# Patient Record
Sex: Male | Born: 1945 | Race: White | Hispanic: No | Marital: Single | Smoking: Never smoker
Health system: Southern US, Community
[De-identification: ages and names within clinical notes are randomized; demographics above are authoritative.]

## PROBLEM LIST (undated history)

## (undated) DIAGNOSIS — E119 Type 2 diabetes mellitus without complications: Secondary | ICD-10-CM

---

## 1997-11-07 ENCOUNTER — Ambulatory Visit (HOSPITAL_COMMUNITY): Admission: RE | Admit: 1997-11-07 | Discharge: 1997-11-07 | Payer: Self-pay | Admitting: Family Medicine

## 2003-05-29 ENCOUNTER — Emergency Department (HOSPITAL_COMMUNITY): Admission: EM | Admit: 2003-05-29 | Discharge: 2003-05-29 | Payer: Self-pay | Admitting: Emergency Medicine

## 2007-06-12 ENCOUNTER — Ambulatory Visit: Payer: Self-pay | Admitting: Pulmonary Disease

## 2007-06-12 ENCOUNTER — Ambulatory Visit: Payer: Self-pay | Admitting: Infectious Diseases

## 2007-06-12 ENCOUNTER — Inpatient Hospital Stay (HOSPITAL_COMMUNITY): Admission: EM | Admit: 2007-06-12 | Discharge: 2007-06-15 | Payer: Self-pay | Admitting: Emergency Medicine

## 2007-06-14 ENCOUNTER — Encounter: Payer: Self-pay | Admitting: Pulmonary Disease

## 2007-07-14 ENCOUNTER — Ambulatory Visit: Payer: Self-pay | Admitting: Pulmonary Disease

## 2007-07-14 DIAGNOSIS — F17200 Nicotine dependence, unspecified, uncomplicated: Secondary | ICD-10-CM

## 2007-07-14 DIAGNOSIS — Z9981 Dependence on supplemental oxygen: Secondary | ICD-10-CM

## 2007-07-14 DIAGNOSIS — G4730 Sleep apnea, unspecified: Secondary | ICD-10-CM | POA: Insufficient documentation

## 2007-07-14 DIAGNOSIS — J449 Chronic obstructive pulmonary disease, unspecified: Secondary | ICD-10-CM

## 2007-07-14 DIAGNOSIS — J4489 Other specified chronic obstructive pulmonary disease: Secondary | ICD-10-CM | POA: Insufficient documentation

## 2007-07-18 DIAGNOSIS — I10 Essential (primary) hypertension: Secondary | ICD-10-CM | POA: Insufficient documentation

## 2007-07-18 DIAGNOSIS — E785 Hyperlipidemia, unspecified: Secondary | ICD-10-CM

## 2007-07-25 ENCOUNTER — Encounter: Payer: Self-pay | Admitting: Pulmonary Disease

## 2009-05-21 IMAGING — CT CT ANGIO CHEST
4 of 5 series · 19 of 30 positions shown · IV contrast (100ml omni 350)
Comparison: Chest x-ray dated 06/12/07.

CLINICAL DATA: COPD exacerbation.  Rule out pulmonary embolus.
 CT ANGIOGRAPHY OF CHEST:
TECHNIQUE: Multidetector CT imaging of the chest was performed during bolus injection of intravenous contrast.  Multiplanar CT angiographic image reconstructions were generated to evaluate the vascular anatomy.
 Contrast:  100 cc Omnipaque 350

[Series 2: pe · axial · 0.86mm/px · z∈[-306,-89]mm · 8 of 233 slices shown]
[im 30/233  lung]
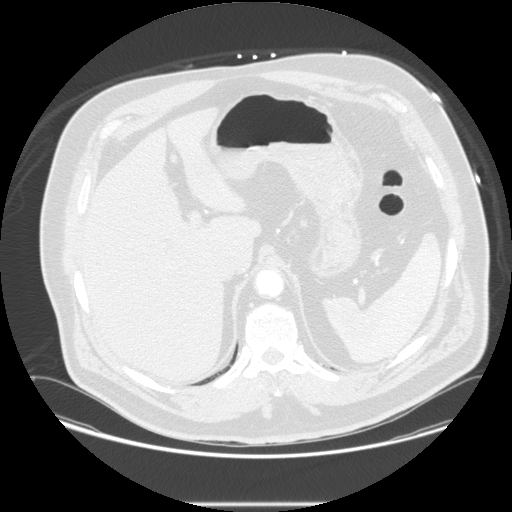
[im 59/233  mediastinal]
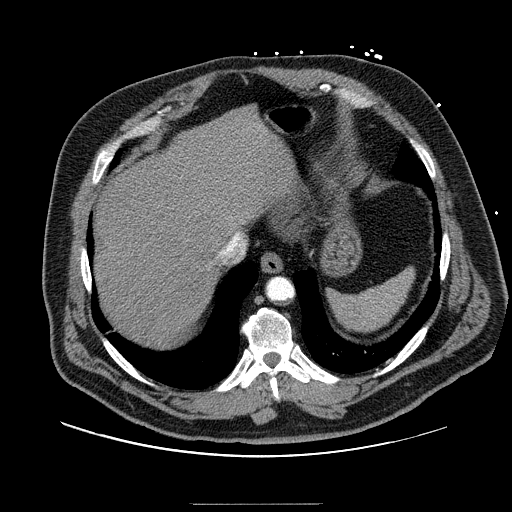
[im 88/233  lung]
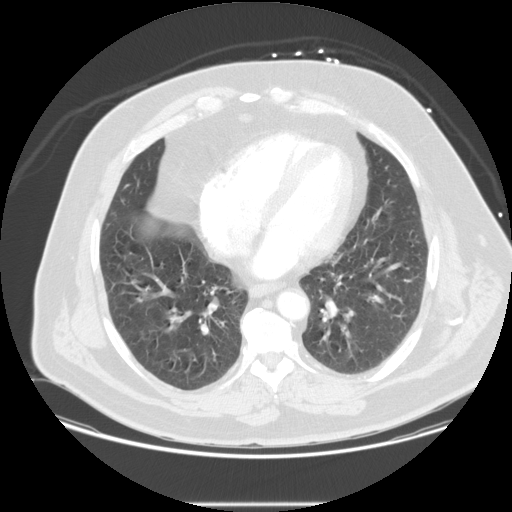
[im 117/233  mediastinal]
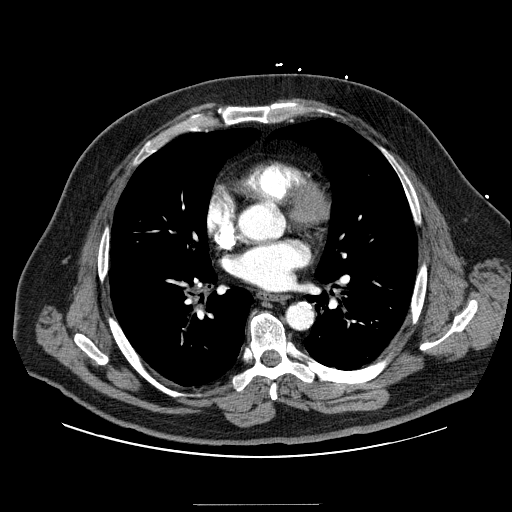
[im 140/233  lung]
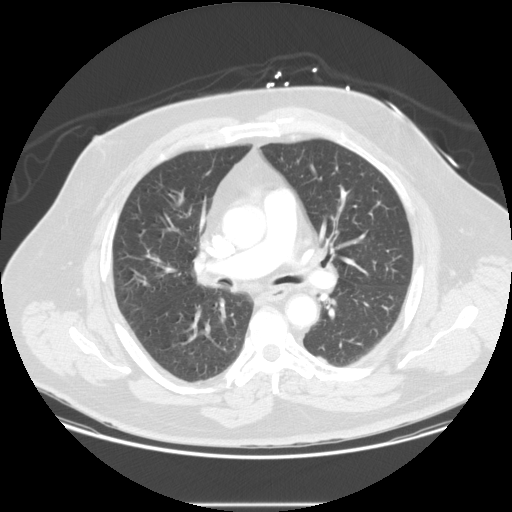
[im 146/233  mediastinal]
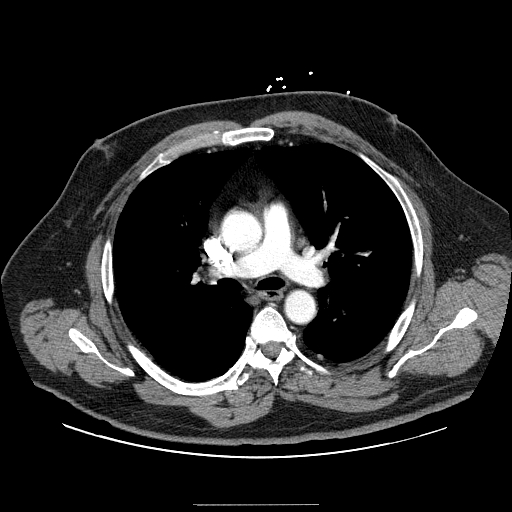
[im 175/233  lung]
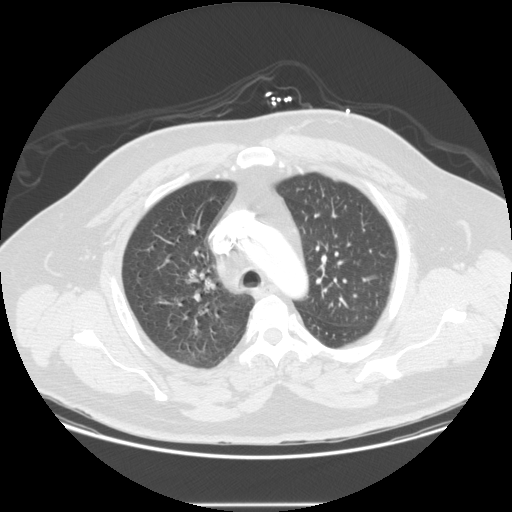
[im 204/233  mediastinal]
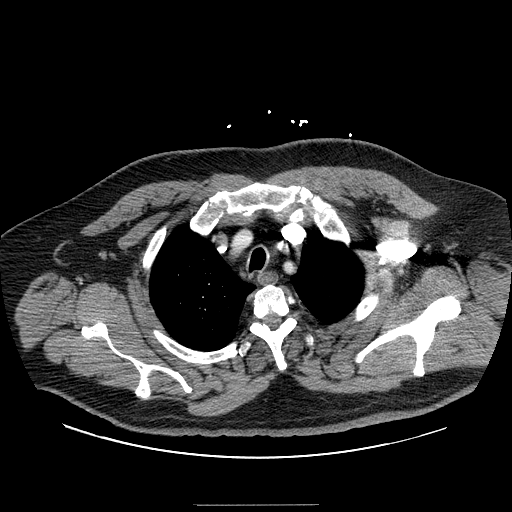

[Series 3: recon 2: pe · axial · 0.86mm/px · z∈[-270,-125]mm · 4 of 117 slices shown]
[im 30/117  lung]
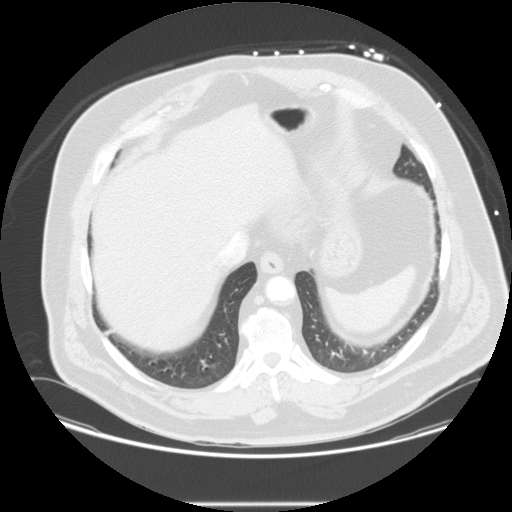
[im 59/117  lung]
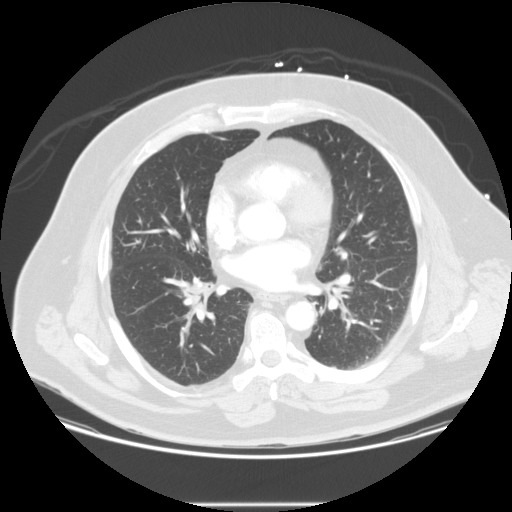
[im 71/117  lung]
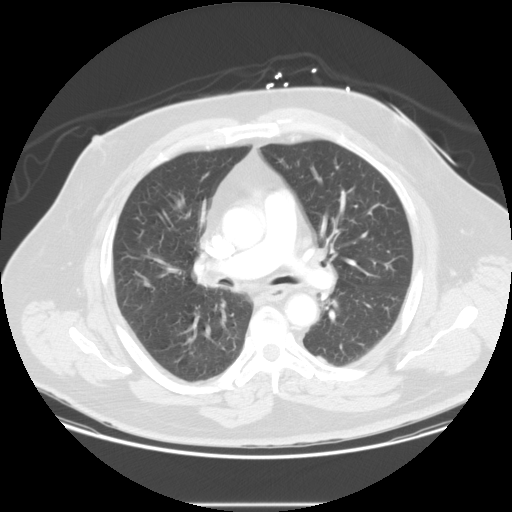
[im 88/117  lung]
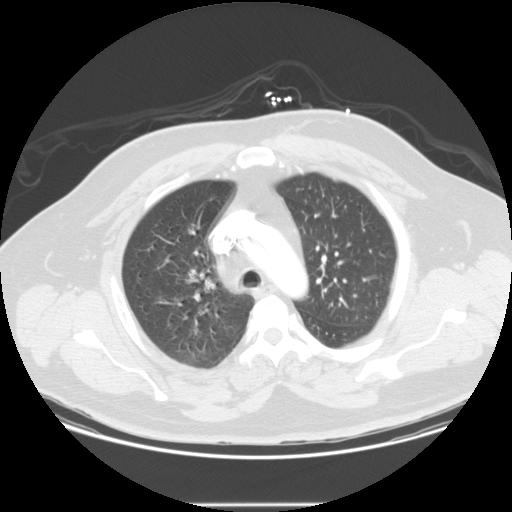

[Series 201: reformatted · sagittal · 0.86mm/px · 5 of 174 slices shown (1 of 2)]
[im 29/174  lung]
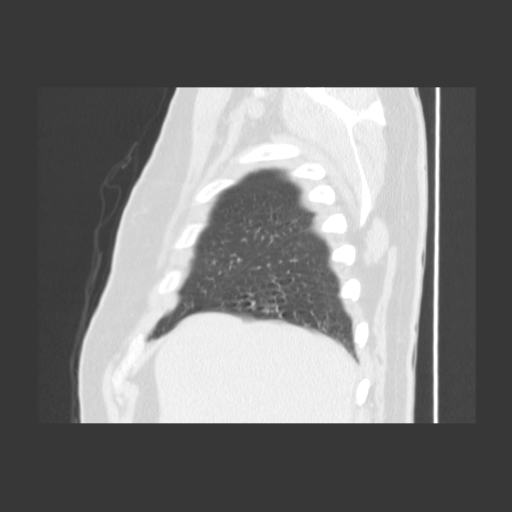
[im 58/174  lung]
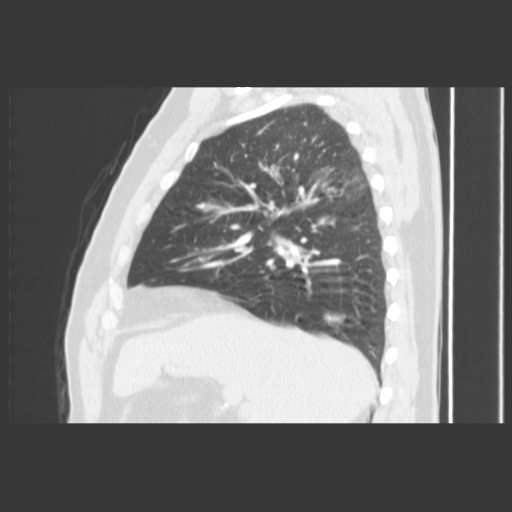
[im 87/174  lung]
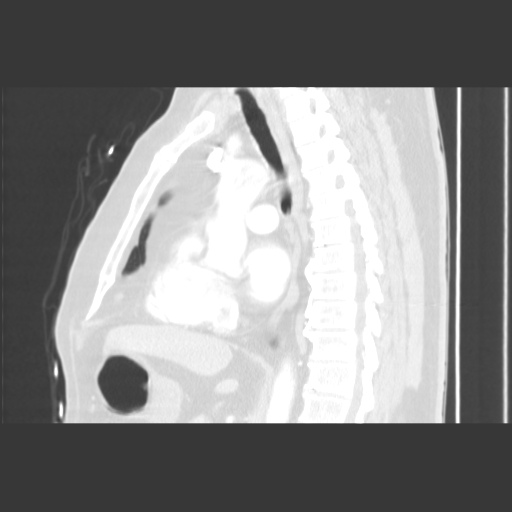
[im 116/174  lung]
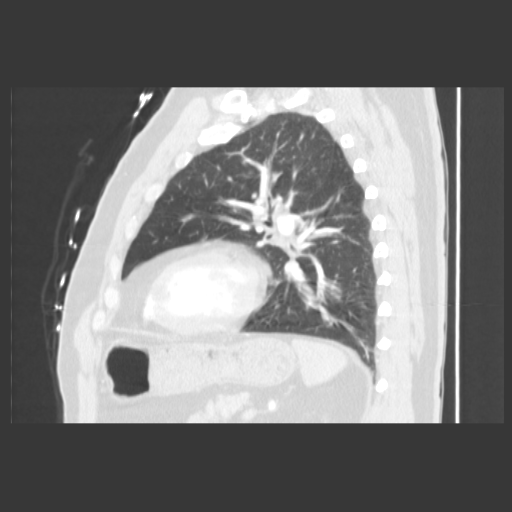
[im 145/174  lung]
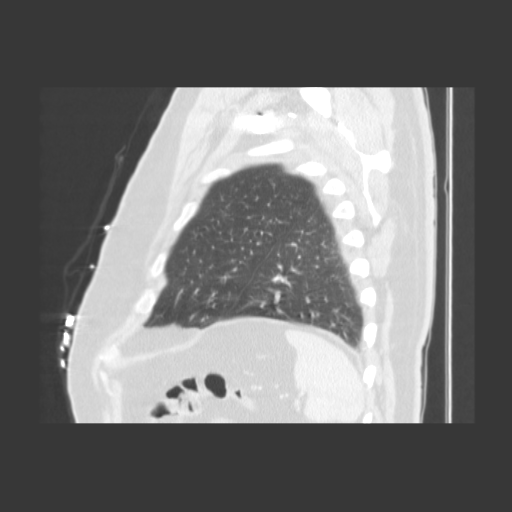

[Series 202: reformatted · coronal · 0.86mm/px · 2 of 142 slices shown (2 of 2)]
[im 29/142  lung]
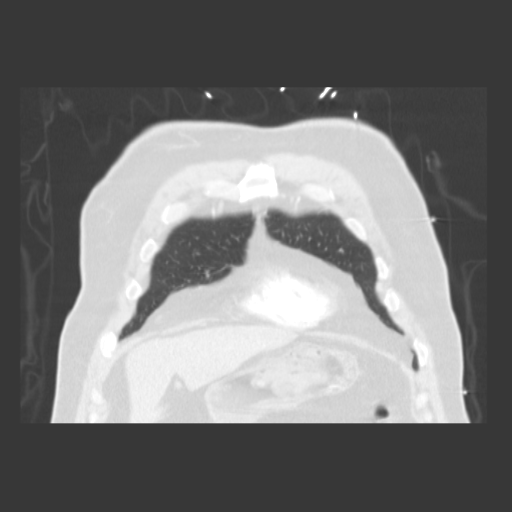
[im 57/142  lung]
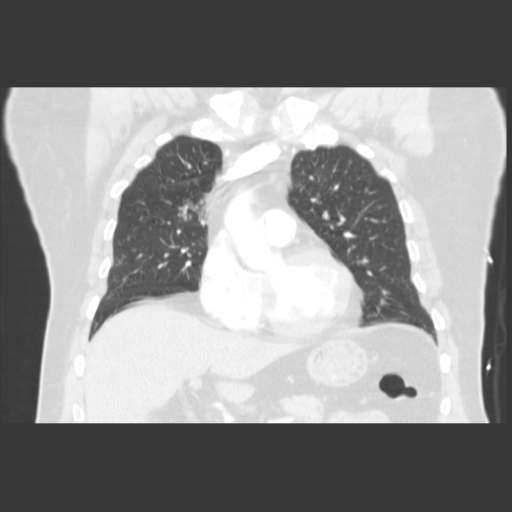

[19 of 30 positions shown; findings below may reference images not displayed]

FINDINGS: Images of the thoracic inlet are unremarkable.  Heart size within normal limits.  Mild degenerative changes of thoracic spine are seen.  Mild atherosclerotic calcification of aortic knob are seen.  There is no aortic aneurysm.  There is no hilar or mediastinal adenopathy.  No pulmonary embolus is noted.  Bilateral mild central peribronchial thickening noted.  Peribronchial inflammation is suspected.  
 Images of the lung parenchyma shows no acute infiltrate or pleural effusion.  No focal consolidation is noted.  
 There is a nonspecific anterior pericardiac mediastinal lymph node which measures 1 x .8 cm.  
 Images of the upper abdomen shows mild hepatic fatty infiltration.  No adrenal gland mass is noted.  
 Computer reconstructed images of thoracic aorta shows again no evidence of pulmonary embolus.
IMPRESSION: 1.  Mild bilateral perihilar/peribronchial thickening.  Mild peribronchial inflammation is suspected.  
 2.  No pulmonary embolus. 
 3.  No acute infiltrate or pleural effusion.  
 4.  Nonspecific anterior mediastinal pericardiac lymph node measures 1 x .8 cm.

## 2010-08-12 NOTE — H&P (Signed)
Micheal Carlson, Micheal Carlson               ACCOUNT NO.:  [id number]   MEDICAL RECORD NO.:  [id number]          PATIENT TYPE:  EMS   LOCATION:  MAJO                         FACILITY:  [facility]   PHYSICIAN:  Mick Sell, MD DATE OF BIRTH:  March 15, 1946   DATE OF ADMISSION:  06/12/2007  DATE OF DISCHARGE:                              HISTORY & PHYSICAL   ADMITTING TO:  Dr.  Adela Glimpse.   PRIMARY DOCTOR:  Dr. Marinda Elk.   CHIEF COMPLAINT:  Shortness of breath and cough.   HISTORY OF PRESENT ILLNESS:  This is a pleasant 65 year old white male  with a history of longstanding tobacco abuse, hypertension,  hyperlipidemia, who complains of one week of increasing cough productive  of a yellow-green sputum, as well as subjective fevers, shortness of  breath, and chills.  He reports mild night sweats.  He says he was seen  by Dr. Foy Guadalajara on Tuesday, 4 days ago, and was given samples it sounds  like to levofloxacin.  He was also given a prescription for levofloxacin  but never had this filled.  He says he did not have much improvement  with the levofloxacin, however he does report that his sputum has  changed from yellow-green to clear.  He has not actually taken his  temperature so he does not know if he has had actual fevers, but he does  report subjective fevers.  He does continue to smoke, about a pack and a  half per day, but he does not drink any alcohol.  When he was seen in  the emergency room initially, he was afebrile but hypertensive and with  an O2 saturation of 86% on room air, up to 95% on several liters.  He  was given Solu-Medrol as well as albuterol and Atrovent nebs.  Chest x-  ray showed possible viral pneumonia, but no focal consolidation.   PAST MEDICAL HISTORY:  1. Hypertension.  2. Hyperlipidemia.  The patient denies a history of being told he has COPD or emphysema.  He  denies recent admission or prior admissions for pneumonia or asthma.   PAST SURGICAL HISTORY:  The  patient denies any surgeries, although it is  noted he had a repair of a finger in 2005.   SOCIAL HISTORY:  The patient is a retired Midwife.  He currently smokes  a pack and a half per day.  He says he used to drink heavily but quit 10  years ago.  Denies any other drugs.  He lives alone but does have his  brother in the neighborhood.   FAMILY HISTORY:  Noncontributory.   MEDICATIONS:  1. Simvastatin 80 mg once a day.  2. Hydrochlorothiazide 25 mg once a day.  3. Lisinopril 20 mg once a day.  4. Metoprolol 50 mg b.i.d.  5. Aspirin 1 tablet once a day, 325 mg.   ALLERGIES:  NO KNOWN DRUG ALLERGIES.   PHYSICAL EXAMINATION:  The patient is a heavy-set disheveled white male  in moderate respiratory distress.  His temperature was 97.2, pulse of  94, blood pressure was elevated to 187/100 to 197/87.  Respirations  were  20-28 times per minute.  Saturation was 86% initially on room air.  HEENT:  His conjunctiva are injected but anicteric, his pupils are  equal, round and reactive to light and accommodation.  Extraocular  movements are intact.  His oropharynx is somewhat dry.  NECK:  Supple, no anterior cervical or posterior cervical or  supraclavicular lymphadenopathy.  HEART:  Regular with somewhat distant heart sounds.  LUNGS:  Diffuse crackles and prolonged expiratory phase.  ABDOMEN:  Soft, nontender, nondistended.  No hepatosplenomegaly.  EXTREMITIES:  No cyanosis or clubbing but there is 1+ bilateral pedal  edema.  NEURO:  He is alert and oriented x3 with a gross and nonfocal neuro  exam, although he is somewhat jittery and anxious appearing.   DATA:  The patient had blood work done and it revealed white blood cell  count of 8.6, hemoglobin elevated at 18.5, platelets of 199, 53%  neutrophils, 34% lymphocytes.  His INR is 0.8.  Sodium 134, potassium  4.4, chloride 96, CO2 27, BUN 12, creatinine 0.8, glucose 118.  LFTs are  within normal limits, except mildly elevated ALT of 55.   Albumin is 3.4.  The patient also had a chest x-ray done which showed some peribronchial  cuffing, possibly suggestive of a viral process.  EKG showed normal sinus rhythm with some left axis deviation but no  acute ST or T-wave changes.  There is no prior comparison.   ASSESSMENT:  This is a pleasant 65 year old male, hypertension,  hyperlipidemia, and prolonged heavy smoker without a prior diagnosis of  COPD or emphysema who presents with cough, progressive shortness of  breath, and wheezing and subjective fevers for the last week.  He has  received some levofloxacin without impressive response.  He was hypoxic  when he initially presented, but his chest x-ray is relatively clear.  He does have diffuse expiratory wheezes and prolonged expiratory phase  on his lung exam but his white count is within normal limits.  Differential would include COPD exacerbation, viral pneumonia, CHF  exacerbation.  I think he most likely has COPD with acute exacerbation.   PLAN:  1. We will admit to step-down unit for close monitoring.  We will put      him on oxygen and follow is O2 sats.  If we need to we can get an      ABG.  2. We will give him albuterol, Atrovent nebs as well as prednisone      taper.  He already received Solu-Medrol 120 and we will put him on      60 mg b.i.d. of prednisone.  3. It does not appear he has pneumonia.  He has a normal white count,      but he does report productive sputum and we will start him on      doxycycline for bronchitis, 100 mg p.o. b.i.d.  4. Hypertension.  We have placed him back on his hydrochlorothiazide,      lisinopril and metoprolol.  He is markedly hypertensive, although I      am not sure if his compliance as most of his pill bottles were full      that he had with him and they were filled in December of 2008.  I      will also place him on an aspirin.  5. I will check a BNP just to evaluate for possible CHF, although his      chest x-ray does not  suggest pulmonary  edema.  6. Prophylaxis:  I will place the patient on DVT prophylaxis with      subcutaneous Lovenox.  I will not place him on a proton pump      inhibitor at this point, but that can be reevaluated in the future.      Mick Sell, MD  Electronically Signed     DPF/MEDQ  D:  06/12/2007  T:  06/12/2007  Job:  [id number]

## 2010-08-12 NOTE — Discharge Summary (Signed)
Micheal Carlson, Micheal Carlson               ACCOUNT NO.:  [id number]   MEDICAL RECORD NO.:  [id number]          PATIENT TYPE:  INP   LOCATION:  2626                         FACILITY:  [facility]   PHYSICIAN:  Michiel Cowboy, MDDATE OF BIRTH:  08-Jun-1945   DATE OF ADMISSION:  06/12/2007  DATE OF DISCHARGE:  06/15/2007                               DISCHARGE SUMMARY   DISCHARGE DIAGNOSES:  1. Chronic obstructive pulmonary disease exacerbation.  2. Hyperlipidemia.  3. Hypertension.  4. Tobacco abuse.   STUDIES:  CT of the chest done on June 14, 2007, negative for PE.  Chest x-ray done on June 12, 2007, showing coarse bronchovascular  markings, bronchiolectasis suggests viral process.  No pneumonia noted.   HOSPITAL COURSE:  The patient is a 65 year old gentleman with past  medical history significant for extensive tobacco abuse, hypertension,  and hyperlipidemia presented with severe shortness of breath, was noted  be severely hypoxic requiring at least 4 L of oxygen.  The patient was  admitted for possible COPD exacerbation and was started on prednisone  given the wheezing as well as antibiotics, Avelox, Advair, and Atrovent.  The patient continued to be requiring 4 L of oxygen, but insisted on  going home stating that his grandchild needs to be taken care of.  Home  oxygen was arranged for the patient given severe shortness of breath and  severe hypoxia.  CT scan of the chest was obtained to rule out PE, which  was negative.  Also, Pulmonology was called to see if they have any  final suggestions in improving his hypoxia.  We continued current  management with Advair, Spiriva, prednisone, and antibiotics including  doxycycline 100 mg one tablet twice a day for 4 days for a total of 7  days.   Hypertension.  Continue his home medications.  We will discharge him on  this.   COPD.  The patient to follow up with Dr. Vassie Loll, also to stop smoking.  The patient is to be discharged home on 4 L  of oxygen.   DISCHARGE MEDICATIONS:  1. Advair 250/50 inhale 2 times a day.  2. Spiriva 18 mcg inhale daily.  3. Doxycycline 100 mg p.o. 2 times a day for 4 days.  4. Prednisone taper slowly over 4 weeks.  5. Nicotine patch daily.  6. Albuterol 2 puffs every 4-6 hours as needed for wheezing.  7. Simvastatin 80 mg daily.  8. Hydrochlorothiazide 25 mg p.o. daily.  9. Lisinopril 20 mg p.o. daily.  10.Metoprolol 50 mg p.o. twice a day.  11.Aspirin 325 mg p.o. daily.  12.Home oxygen 4 L.   FOLLOWUP:  The patient to follow up with Dr. Foy Guadalajara in 1 week and follow  up with Dr. Vassie Loll scheduled on the July 14, 2007, at 1:30 p.m.      Michiel Cowboy, MD  Electronically Signed     AVD/MEDQ  D:  07/12/2007  T:  07/12/2007  Job:  [id number]   cc:   Oretha Milch, MD  Doris Cheadle Foy Guadalajara, M.D.

## 2010-08-12 NOTE — Consult Note (Signed)
NAMEFRIEDRICH, Micheal Carlson               ACCOUNT NO.:  [id number]   MEDICAL RECORD NO.:  [id number]          PATIENT TYPE:  INP   LOCATION:  2626                         FACILITY:  [facility]   PHYSICIAN:  Oretha Milch, MD      DATE OF BIRTH:  06-Jan-1946   DATE OF CONSULTATION:  06/14/2007  DATE OF DISCHARGE:                                 CONSULTATION   PRIMARY CARE PHYSICIAN:  Robert L. Foy Guadalajara, M.D.   REASON FOR CONSULTATION:  Persistent hypoxia.   HISTORY OF PRESENT ILLNESS:  Mr. 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  He smokes about 1-1/2 to 2 packs per day for the last  40 years.  He denies chronic dyspnea or sputum production.  About 4 days  prior to admission, he developed green phlegm with subjective fevers,  dyspnea and wheezing.  He was seen by Dr. Foy Guadalajara and given samples of  Levaquin.  He was given a prescription which he did not fill.  His  breathing worsened over the next 2 days.  He sought emergency room  attention and was admitted to the hospital.  Over the past few days, he  was treated with Solu-Medrol and antibiotics and he states that he is  more than 50% better.  However, he remains on 4 liters of oxygen, hence  we are consulted.   The sputum has changed from yellow to clear now.  He is able to ambulate  in the room.  He is insisting that he wants to go home in 24 hours  because there is no one to take care of his grandchild.  There is no  history of chest pain, palpitations or pedal edema.   PAST MEDICAL HISTORY:  Includes hypertension and hyperlipidemia.   PAST SURGICAL HISTORY:  Trauma to finger and the right hand with a car  door.   SOCIAL HISTORY:  Smokes about 1-1/2 to 2 packs per day.  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  Lives with a lady  friend and a 64 year old  grandchild of whom he has custody.   FAMILY HISTORY:  Noncontributory.   REVIEW OF SYSTEMS:  Denies recurrent colds or pneumonias and prefers  cold weather having worked under cold conditions as a Midwife.   ALLERGIES:  NO KNOWN DRUG ALLERGIES.   CURRENT MEDICATIONS:  Medications prior to admission include:  1. HCTZ 25 mg daily.  2. Simvastatin 80 mg daily.  3. Lisinopril 20 mg daily.  4. Metoprolol 50 mg daily.  5. Mucinex DM.  6. Aspirin 325 mg daily.  7. ProAir MDI p.r.n.   PHYSICAL EXAMINATION:  GENERAL:  Adult gentleman sitting up in bed in  mild respiratory distress.  VITAL SIGNS:  Heart rate 96 per minute, respirations 24 per minute,  oxygen saturation 93% on 5 liters nasal cannula, 92% on 4 liters nasal  cannula, blood pressure 142/68.  HEENT:  Narrow pharyngeal space, class III airway.  NECK:  Supple.  No JVD.  Fullness in the supraclavicular region.  ABDOMEN:  Soft, nontender.  CVS:  S1 and S2 distant.  CHEST:  Decreased breath sounds in both bases.  Faint rhonchi.  Prolonged expiration.  NEUROLOGIC:  Nonfocal.  No asterixis.   LABORATORY DATA:  Arterial blood gas was 7.44, 44, 59, 91% on 4 liters  nasal cannula.  WBC count on admission was 8.6, hemoglobin 18.5,  platelets 199.  BNP level was less than 30, BUN and creatinine on  admission was 12 and 0.8, potassium 4.4, sodium 134, bicarbonate 27,  SGPT was 3455, total bilirubin 0.8, albumin 3.4, calcium 9.0.   IMPRESSION:  1. Hypoxemia is likely on the basis of chronic obstructive lung      disease.  The degree of hypoxia suggests that his airway      obstruction is likely worse than is apparent based on symptoms.  2. Chronic obstructive pulmonary disease exacerbation.  3. Heavy tobacco abuse.  4. Polycythemia, this again indicates that the hypoxia is somewhat      longer standing than his apparent history.   The differential diagnosis includes pulmonary embolism in view of clear  chest x-ray, however, the  clinical probability of pulmonary embolism is  low.  He does not seem to have any obvious risk factors.   RECOMMENDATIONS:  1. A spiral CT chest has been scheduled  2. Since he does have ongoing bronchospasm, I would continue his      current dose of prednisone at 60 mg and taper this gradually over      the next 2-4 weeks depending on his progress.  If his bronchospasm      is persistent, we may have to consider discontinuing his beta      blockade, but it seems that he has tolerated this well and by      history the symptoms are more than 50% improved.  3. He has been placed on Advair 250/50 b.i.d. and I would continue      this on discharge.  I would add Spiriva inhaler to his regimen.  1. Smoking cessation was discussed in great detail.  He seems to be      motivated to quit.  He expressed that he has done this before with      alcohol.  He has not smoked now for 3 days and I think we should      seize this opportunity.  He seemed to be agreeable to try nicotine      patches and we will place him on 21 mg per day.   Thank you Dr. Adela Glimpse for involving [place] in the care of this patient.  I  would be happy to arrange followup for him on discharge since he does  seem to be insisting on discharge in the next 24 hours.  He would  certainly need home oxygen set up and I would start this at 4 liters per  minute.  I have emphasized to him the need to use his oxygen daily  including during sleep.  Pulmonary function testing can be estimated in  a few weeks once the acute exacerbation is resolved and he is off oral  steroids.      Oretha Milch, MD  Electronically Signed    RVA/MEDQ  D:  06/14/2007  T:  06/14/2007  Job:  [id number]

## 2010-08-15 NOTE — Consult Note (Signed)
NAME:  COURVOISIER, HAMBLEN                         ACCOUNT NO.:  [id number]   MEDICAL RECORD NO.:  [id number]                   PATIENT TYPE:  EMS   LOCATION:  ED                                   FACILITY:  [facility]   PHYSICIAN:  J. Darreld Mclean, M.D.              DATE OF BIRTH:  [birth year]   DATE OF CONSULTATION:  DATE OF DISCHARGE:                                   CONSULTATION   CONSULTING PHYSICIAN:  Dr. Teola Bradley.   REQUESTING PHYSICIAN:  Rhae Lerner. Margretta Ditty, M.D., ER physician here at [facility].   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  The  patient caught primarily his left ring finger in the grinder.  There is no  other reported injury.  He was brought to the emergency room by a friend for  evaluation an hour ago.  Examination of the patient's left ring finger did  reveal a near amputation of the tip of the finger with loss of a portion of  the nail.  There was no other injury noted to the left hand, except for a  superficial abrasion to the dorsum of the left ring finger.  Examination of  the other fingers and hand did not reveal any injury, except superficial  abrasions.  Neurovascular is intact.   X-rays taken of the left ring finger showed a tuft/evulsion fracture at the  tip of the distal phalanx, left ring finger.  Also noted on these x-rays was  a metallic foreign body in the left long finger at the PIP joint.   ANESTHESIA:  The patient was given Xylocaine 1% 10 cc digital block at the  base of the left ring finger.   PREP:  The patient's left hand was soaked in Betadine solution for one and a  half hours, prior to repair of the wound.  The ring finger was then scrubbed  for five minutes with a Betadine solution/scrub.   PROCEDURE:  Left ring finger was prepped with Betadine, washed again and  dressed in a sterile condition.  The wound edges were  well approximated.  The nailbed had to be realigned.  Ten interrupted #3 Nylon sutures were then  placed.  I had to use a blood pressure cuff as a tourniquet of the arm for  hemostasis.  Tourniquet time was approximately 25 minutes.  Sterile dressing  and a narrow aluminum splint were applied to the left ring finger.  The  patient was evaluated with Dr. Hilda Lias prior to leaving the emergency room.   IMPRESSION:  Open fracture on left ring finger (tuft injury).   PLAN:  The patient is asked to followup in our office on June 01, 2003.  He  was given a patient information booklet along with emergency numbers.  If  any problems, I have  asked him to come back to the hospital tonight.  He was  prescribed Tylox, number dispensed 40, Motrin 800 mg t.i.d. p.c. and Keflex  500 mg q.i.d. x 7 days.   He is to keep it clean and dry, elevate his left hand at all times.  If any  problems, again, I asked him to contact our office or come back to the  emergency room.     ________________________________________  ___________________________________________  Candace Cruise, P.ATeola Bradley, M.D.   BB/MEDQ  D:  05/29/2003  T:  05/30/2003  Job:  [id number]

## 2010-12-22 LAB — CBC
HCT: 50.4
Hemoglobin: 17
MCHC: 33.5
MCHC: 33.8
MCV: 87.1
MCV: 87.4
Platelets: 199
RBC: 5.48
RBC: 6.34 — ABNORMAL HIGH
RDW: 13.4
RDW: 13.5
WBC: 19.2 — ABNORMAL HIGH
WBC: 8.6

## 2010-12-22 LAB — PROTIME-INR: INR: 0.8

## 2010-12-22 LAB — COMPREHENSIVE METABOLIC PANEL
AST: 34
BUN: 12
CO2: 27
Chloride: 96
Creatinine, Ser: 0.8
GFR calc non Af Amer: >60
Potassium: 4.4
Sodium: 134 — ABNORMAL LOW

## 2010-12-22 LAB — BLOOD GAS, ARTERIAL
Acid-Base Excess: 5.2 — ABNORMAL HIGH
Bicarbonate: 29.4 — ABNORMAL HIGH
TCO2: 30.7
pCO2 arterial: 43.7
pH, Arterial: 7.441
pO2, Arterial: 58.7 — ABNORMAL LOW

## 2010-12-22 LAB — BASIC METABOLIC PANEL
BUN: 19
CO2: 31
Chloride: 99
Creatinine, Ser: 0.95
GFR calc Af Amer: >60
GFR calc non Af Amer: >60
GFR calc non Af Amer: >60
Glucose, Bld: 126 — ABNORMAL HIGH
Glucose, Bld: 149 — ABNORMAL HIGH
Potassium: 4.9
Sodium: 136
Sodium: 138

## 2010-12-22 LAB — HEMOGLOBIN A1C: Mean Plasma Glucose: 175

## 2010-12-22 LAB — DIFFERENTIAL
Basophils Relative: 2 — ABNORMAL HIGH
Eosinophils Absolute: 0.2
Eosinophils Relative: 2
Lymphocytes Relative: 34
Lymphs Abs: 2.9
Neutro Abs: 4.6
Neutrophils Relative %: 53

## 2010-12-22 LAB — EXPECTORATED SPUTUM ASSESSMENT W GRAM STAIN, RFLX TO RESP C

## 2010-12-22 LAB — APTT: aPTT: 24

## 2010-12-22 LAB — CULTURE, RESPIRATORY W GRAM STAIN: Gram Stain: NONE SEEN

## 2014-04-03 DIAGNOSIS — I739 Peripheral vascular disease, unspecified: Secondary | ICD-10-CM | POA: Diagnosis not present

## 2014-04-03 DIAGNOSIS — E782 Mixed hyperlipidemia: Secondary | ICD-10-CM | POA: Diagnosis not present

## 2014-04-03 DIAGNOSIS — N183 Chronic kidney disease, stage 3 (moderate): Secondary | ICD-10-CM | POA: Diagnosis not present

## 2014-04-03 DIAGNOSIS — L821 Other seborrheic keratosis: Secondary | ICD-10-CM | POA: Diagnosis not present

## 2014-04-03 DIAGNOSIS — E1165 Type 2 diabetes mellitus with hyperglycemia: Secondary | ICD-10-CM | POA: Diagnosis not present

## 2014-04-03 DIAGNOSIS — I129 Hypertensive chronic kidney disease with stage 1 through stage 4 chronic kidney disease, or unspecified chronic kidney disease: Secondary | ICD-10-CM | POA: Diagnosis not present

## 2014-04-18 ENCOUNTER — Other Ambulatory Visit (HOSPITAL_COMMUNITY): Payer: Self-pay | Admitting: Family Medicine

## 2014-04-18 DIAGNOSIS — I739 Peripheral vascular disease, unspecified: Secondary | ICD-10-CM

## 2014-04-20 ENCOUNTER — Ambulatory Visit (HOSPITAL_COMMUNITY): Payer: Medicare HMO

## 2014-05-02 ENCOUNTER — Ambulatory Visit (HOSPITAL_COMMUNITY)
Admission: RE | Admit: 2014-05-02 | Discharge: 2014-05-02 | Disposition: A | Discharge status: Home / Self Care | Payer: Medicare HMO | Source: Ambulatory Visit | Attending: Family Medicine | Admitting: Family Medicine

## 2014-05-02 DIAGNOSIS — E669 Obesity, unspecified: Secondary | ICD-10-CM | POA: Diagnosis not present

## 2014-05-02 DIAGNOSIS — M79604 Pain in right leg: Secondary | ICD-10-CM | POA: Diagnosis not present

## 2014-05-02 DIAGNOSIS — M79605 Pain in left leg: Secondary | ICD-10-CM | POA: Diagnosis not present

## 2014-05-02 DIAGNOSIS — I10 Essential (primary) hypertension: Secondary | ICD-10-CM | POA: Insufficient documentation

## 2014-05-02 DIAGNOSIS — E119 Type 2 diabetes mellitus without complications: Secondary | ICD-10-CM | POA: Diagnosis not present

## 2014-05-02 DIAGNOSIS — E785 Hyperlipidemia, unspecified: Secondary | ICD-10-CM | POA: Insufficient documentation

## 2014-05-02 DIAGNOSIS — I739 Peripheral vascular disease, unspecified: Secondary | ICD-10-CM | POA: Insufficient documentation

## 2014-05-02 DIAGNOSIS — M79606 Pain in leg, unspecified: Secondary | ICD-10-CM | POA: Diagnosis present

## 2014-07-12 DIAGNOSIS — E782 Mixed hyperlipidemia: Secondary | ICD-10-CM | POA: Diagnosis not present

## 2014-07-12 DIAGNOSIS — E1165 Type 2 diabetes mellitus with hyperglycemia: Secondary | ICD-10-CM | POA: Diagnosis not present

## 2014-07-12 DIAGNOSIS — N183 Chronic kidney disease, stage 3 (moderate): Secondary | ICD-10-CM | POA: Diagnosis not present

## 2014-07-12 DIAGNOSIS — I129 Hypertensive chronic kidney disease with stage 1 through stage 4 chronic kidney disease, or unspecified chronic kidney disease: Secondary | ICD-10-CM | POA: Diagnosis not present

## 2014-10-11 DIAGNOSIS — Z794 Long term (current) use of insulin: Secondary | ICD-10-CM | POA: Diagnosis not present

## 2014-10-11 DIAGNOSIS — E1165 Type 2 diabetes mellitus with hyperglycemia: Secondary | ICD-10-CM | POA: Diagnosis not present

## 2015-01-14 DIAGNOSIS — E1165 Type 2 diabetes mellitus with hyperglycemia: Secondary | ICD-10-CM | POA: Diagnosis not present

## 2015-01-24 DIAGNOSIS — I129 Hypertensive chronic kidney disease with stage 1 through stage 4 chronic kidney disease, or unspecified chronic kidney disease: Secondary | ICD-10-CM | POA: Diagnosis not present

## 2015-01-24 DIAGNOSIS — E1165 Type 2 diabetes mellitus with hyperglycemia: Secondary | ICD-10-CM | POA: Diagnosis not present

## 2015-01-24 DIAGNOSIS — Z1211 Encounter for screening for malignant neoplasm of colon: Secondary | ICD-10-CM | POA: Diagnosis not present

## 2015-01-24 DIAGNOSIS — Z23 Encounter for immunization: Secondary | ICD-10-CM | POA: Diagnosis not present

## 2015-01-30 DIAGNOSIS — H25813 Combined forms of age-related cataract, bilateral: Secondary | ICD-10-CM | POA: Diagnosis not present

## 2015-01-30 DIAGNOSIS — H5200 Hypermetropia, unspecified eye: Secondary | ICD-10-CM | POA: Diagnosis not present

## 2015-01-30 DIAGNOSIS — Z0100 Encounter for examination of eyes and vision without abnormal findings: Secondary | ICD-10-CM | POA: Diagnosis not present

## 2015-01-30 DIAGNOSIS — E109 Type 1 diabetes mellitus without complications: Secondary | ICD-10-CM | POA: Diagnosis not present

## 2015-01-30 DIAGNOSIS — H5210 Myopia, unspecified eye: Secondary | ICD-10-CM | POA: Diagnosis not present

## 2015-02-14 DIAGNOSIS — Z1211 Encounter for screening for malignant neoplasm of colon: Secondary | ICD-10-CM | POA: Diagnosis not present

## 2015-07-03 DIAGNOSIS — I129 Hypertensive chronic kidney disease with stage 1 through stage 4 chronic kidney disease, or unspecified chronic kidney disease: Secondary | ICD-10-CM | POA: Diagnosis not present

## 2015-07-03 DIAGNOSIS — Z7984 Long term (current) use of oral hypoglycemic drugs: Secondary | ICD-10-CM | POA: Diagnosis not present

## 2015-07-03 DIAGNOSIS — E1165 Type 2 diabetes mellitus with hyperglycemia: Secondary | ICD-10-CM | POA: Diagnosis not present

## 2015-07-03 DIAGNOSIS — Z23 Encounter for immunization: Secondary | ICD-10-CM | POA: Diagnosis not present

## 2016-01-24 DIAGNOSIS — E1165 Type 2 diabetes mellitus with hyperglycemia: Secondary | ICD-10-CM | POA: Diagnosis not present

## 2016-01-24 DIAGNOSIS — E781 Pure hyperglyceridemia: Secondary | ICD-10-CM | POA: Diagnosis not present

## 2016-01-24 DIAGNOSIS — E782 Mixed hyperlipidemia: Secondary | ICD-10-CM | POA: Diagnosis not present

## 2016-01-24 DIAGNOSIS — Z7984 Long term (current) use of oral hypoglycemic drugs: Secondary | ICD-10-CM | POA: Diagnosis not present

## 2016-01-24 DIAGNOSIS — Z23 Encounter for immunization: Secondary | ICD-10-CM | POA: Diagnosis not present

## 2016-01-24 DIAGNOSIS — N183 Chronic kidney disease, stage 3 (moderate): Secondary | ICD-10-CM | POA: Diagnosis not present

## 2016-01-24 DIAGNOSIS — I129 Hypertensive chronic kidney disease with stage 1 through stage 4 chronic kidney disease, or unspecified chronic kidney disease: Secondary | ICD-10-CM | POA: Diagnosis not present

## 2016-03-11 DIAGNOSIS — H524 Presbyopia: Secondary | ICD-10-CM | POA: Diagnosis not present

## 2016-03-11 DIAGNOSIS — H25813 Combined forms of age-related cataract, bilateral: Secondary | ICD-10-CM | POA: Diagnosis not present

## 2016-03-11 DIAGNOSIS — E119 Type 2 diabetes mellitus without complications: Secondary | ICD-10-CM | POA: Diagnosis not present

## 2016-07-24 DIAGNOSIS — I129 Hypertensive chronic kidney disease with stage 1 through stage 4 chronic kidney disease, or unspecified chronic kidney disease: Secondary | ICD-10-CM | POA: Diagnosis not present

## 2016-07-24 DIAGNOSIS — E782 Mixed hyperlipidemia: Secondary | ICD-10-CM | POA: Diagnosis not present

## 2016-07-24 DIAGNOSIS — E781 Pure hyperglyceridemia: Secondary | ICD-10-CM | POA: Diagnosis not present

## 2016-07-24 DIAGNOSIS — Z0000 Encounter for general adult medical examination without abnormal findings: Secondary | ICD-10-CM | POA: Diagnosis not present

## 2016-07-24 DIAGNOSIS — Z125 Encounter for screening for malignant neoplasm of prostate: Secondary | ICD-10-CM | POA: Diagnosis not present

## 2016-07-24 DIAGNOSIS — E1165 Type 2 diabetes mellitus with hyperglycemia: Secondary | ICD-10-CM | POA: Diagnosis not present

## 2016-07-24 DIAGNOSIS — Z794 Long term (current) use of insulin: Secondary | ICD-10-CM | POA: Diagnosis not present

## 2016-08-03 DIAGNOSIS — Z1212 Encounter for screening for malignant neoplasm of rectum: Secondary | ICD-10-CM | POA: Diagnosis not present

## 2016-08-03 DIAGNOSIS — Z1211 Encounter for screening for malignant neoplasm of colon: Secondary | ICD-10-CM | POA: Diagnosis not present

## 2016-09-10 DIAGNOSIS — M109 Gout, unspecified: Secondary | ICD-10-CM | POA: Diagnosis not present

## 2016-09-18 DIAGNOSIS — K922 Gastrointestinal hemorrhage, unspecified: Secondary | ICD-10-CM | POA: Diagnosis not present

## 2016-09-18 DIAGNOSIS — R195 Other fecal abnormalities: Secondary | ICD-10-CM | POA: Diagnosis not present

## 2016-09-18 DIAGNOSIS — I129 Hypertensive chronic kidney disease with stage 1 through stage 4 chronic kidney disease, or unspecified chronic kidney disease: Secondary | ICD-10-CM | POA: Diagnosis not present

## 2016-09-18 DIAGNOSIS — K254 Chronic or unspecified gastric ulcer with hemorrhage: Secondary | ICD-10-CM | POA: Diagnosis not present

## 2016-09-18 DIAGNOSIS — K621 Rectal polyp: Secondary | ICD-10-CM | POA: Diagnosis not present

## 2016-09-18 DIAGNOSIS — I10 Essential (primary) hypertension: Secondary | ICD-10-CM | POA: Diagnosis not present

## 2016-09-18 DIAGNOSIS — E119 Type 2 diabetes mellitus without complications: Secondary | ICD-10-CM | POA: Diagnosis not present

## 2016-09-18 DIAGNOSIS — R634 Abnormal weight loss: Secondary | ICD-10-CM | POA: Diagnosis not present

## 2016-09-18 DIAGNOSIS — Z87891 Personal history of nicotine dependence: Secondary | ICD-10-CM | POA: Diagnosis not present

## 2016-09-18 DIAGNOSIS — J441 Chronic obstructive pulmonary disease with (acute) exacerbation: Secondary | ICD-10-CM | POA: Diagnosis not present

## 2016-09-18 DIAGNOSIS — R5383 Other fatigue: Secondary | ICD-10-CM | POA: Diagnosis not present

## 2016-09-18 DIAGNOSIS — R031 Nonspecific low blood-pressure reading: Secondary | ICD-10-CM | POA: Diagnosis not present

## 2016-09-18 DIAGNOSIS — I519 Heart disease, unspecified: Secondary | ICD-10-CM | POA: Diagnosis not present

## 2016-09-18 DIAGNOSIS — R630 Anorexia: Secondary | ICD-10-CM | POA: Diagnosis not present

## 2016-09-18 DIAGNOSIS — K2950 Unspecified chronic gastritis without bleeding: Secondary | ICD-10-CM | POA: Diagnosis not present

## 2016-09-18 DIAGNOSIS — D62 Acute posthemorrhagic anemia: Secondary | ICD-10-CM | POA: Diagnosis not present

## 2016-09-18 DIAGNOSIS — K921 Melena: Secondary | ICD-10-CM | POA: Diagnosis not present

## 2016-09-18 DIAGNOSIS — N183 Chronic kidney disease, stage 3 (moderate): Secondary | ICD-10-CM | POA: Diagnosis not present

## 2016-09-18 DIAGNOSIS — R1030 Lower abdominal pain, unspecified: Secondary | ICD-10-CM | POA: Diagnosis not present

## 2016-09-18 DIAGNOSIS — I358 Other nonrheumatic aortic valve disorders: Secondary | ICD-10-CM | POA: Diagnosis not present

## 2016-09-18 DIAGNOSIS — Z794 Long term (current) use of insulin: Secondary | ICD-10-CM | POA: Diagnosis not present

## 2016-09-18 DIAGNOSIS — E1122 Type 2 diabetes mellitus with diabetic chronic kidney disease: Secondary | ICD-10-CM | POA: Diagnosis not present

## 2016-09-18 DIAGNOSIS — R110 Nausea: Secondary | ICD-10-CM | POA: Diagnosis not present

## 2016-09-18 DIAGNOSIS — R42 Dizziness and giddiness: Secondary | ICD-10-CM | POA: Diagnosis not present

## 2016-09-18 DIAGNOSIS — K253 Acute gastric ulcer without hemorrhage or perforation: Secondary | ICD-10-CM | POA: Diagnosis not present

## 2016-09-18 DIAGNOSIS — K635 Polyp of colon: Secondary | ICD-10-CM | POA: Diagnosis not present

## 2016-09-18 DIAGNOSIS — D122 Benign neoplasm of ascending colon: Secondary | ICD-10-CM | POA: Diagnosis not present

## 2016-09-18 DIAGNOSIS — R109 Unspecified abdominal pain: Secondary | ICD-10-CM | POA: Diagnosis not present

## 2016-09-18 DIAGNOSIS — R12 Heartburn: Secondary | ICD-10-CM | POA: Diagnosis not present

## 2016-09-18 DIAGNOSIS — K259 Gastric ulcer, unspecified as acute or chronic, without hemorrhage or perforation: Secondary | ICD-10-CM | POA: Diagnosis not present

## 2016-09-18 DIAGNOSIS — N179 Acute kidney failure, unspecified: Secondary | ICD-10-CM | POA: Diagnosis not present

## 2016-09-18 DIAGNOSIS — R0602 Shortness of breath: Secondary | ICD-10-CM | POA: Diagnosis not present

## 2016-09-24 DIAGNOSIS — K259 Gastric ulcer, unspecified as acute or chronic, without hemorrhage or perforation: Secondary | ICD-10-CM | POA: Diagnosis not present

## 2016-09-24 DIAGNOSIS — D500 Iron deficiency anemia secondary to blood loss (chronic): Secondary | ICD-10-CM | POA: Diagnosis not present

## 2016-09-24 DIAGNOSIS — K922 Gastrointestinal hemorrhage, unspecified: Secondary | ICD-10-CM | POA: Diagnosis not present

## 2016-09-24 DIAGNOSIS — I129 Hypertensive chronic kidney disease with stage 1 through stage 4 chronic kidney disease, or unspecified chronic kidney disease: Secondary | ICD-10-CM | POA: Diagnosis not present

## 2016-09-28 DIAGNOSIS — D500 Iron deficiency anemia secondary to blood loss (chronic): Secondary | ICD-10-CM | POA: Diagnosis not present

## 2016-09-28 DIAGNOSIS — K922 Gastrointestinal hemorrhage, unspecified: Secondary | ICD-10-CM | POA: Diagnosis not present

## 2016-09-28 DIAGNOSIS — I129 Hypertensive chronic kidney disease with stage 1 through stage 4 chronic kidney disease, or unspecified chronic kidney disease: Secondary | ICD-10-CM | POA: Diagnosis not present

## 2016-09-28 DIAGNOSIS — K259 Gastric ulcer, unspecified as acute or chronic, without hemorrhage or perforation: Secondary | ICD-10-CM | POA: Diagnosis not present

## 2016-10-22 DIAGNOSIS — D500 Iron deficiency anemia secondary to blood loss (chronic): Secondary | ICD-10-CM | POA: Diagnosis not present

## 2017-01-22 DIAGNOSIS — D62 Acute posthemorrhagic anemia: Secondary | ICD-10-CM | POA: Diagnosis not present

## 2017-01-22 DIAGNOSIS — R21 Rash and other nonspecific skin eruption: Secondary | ICD-10-CM | POA: Diagnosis not present

## 2017-01-22 DIAGNOSIS — K279 Peptic ulcer, site unspecified, unspecified as acute or chronic, without hemorrhage or perforation: Secondary | ICD-10-CM | POA: Diagnosis not present

## 2017-01-22 DIAGNOSIS — E782 Mixed hyperlipidemia: Secondary | ICD-10-CM | POA: Diagnosis not present

## 2017-01-22 DIAGNOSIS — M1990 Unspecified osteoarthritis, unspecified site: Secondary | ICD-10-CM | POA: Diagnosis not present

## 2017-01-22 DIAGNOSIS — E1122 Type 2 diabetes mellitus with diabetic chronic kidney disease: Secondary | ICD-10-CM | POA: Diagnosis not present

## 2017-01-22 DIAGNOSIS — I129 Hypertensive chronic kidney disease with stage 1 through stage 4 chronic kidney disease, or unspecified chronic kidney disease: Secondary | ICD-10-CM | POA: Diagnosis not present

## 2017-01-22 DIAGNOSIS — J449 Chronic obstructive pulmonary disease, unspecified: Secondary | ICD-10-CM | POA: Diagnosis not present

## 2017-02-05 DIAGNOSIS — N289 Disorder of kidney and ureter, unspecified: Secondary | ICD-10-CM | POA: Diagnosis not present

## 2017-02-15 DIAGNOSIS — I129 Hypertensive chronic kidney disease with stage 1 through stage 4 chronic kidney disease, or unspecified chronic kidney disease: Secondary | ICD-10-CM | POA: Diagnosis not present

## 2017-03-01 DIAGNOSIS — I129 Hypertensive chronic kidney disease with stage 1 through stage 4 chronic kidney disease, or unspecified chronic kidney disease: Secondary | ICD-10-CM | POA: Diagnosis not present

## 2017-03-04 DIAGNOSIS — Z0100 Encounter for examination of eyes and vision without abnormal findings: Secondary | ICD-10-CM | POA: Diagnosis not present

## 2017-03-04 DIAGNOSIS — H25813 Combined forms of age-related cataract, bilateral: Secondary | ICD-10-CM | POA: Diagnosis not present

## 2017-03-04 DIAGNOSIS — H524 Presbyopia: Secondary | ICD-10-CM | POA: Diagnosis not present

## 2017-09-22 DIAGNOSIS — E1122 Type 2 diabetes mellitus with diabetic chronic kidney disease: Secondary | ICD-10-CM | POA: Diagnosis not present

## 2017-09-22 DIAGNOSIS — E1165 Type 2 diabetes mellitus with hyperglycemia: Secondary | ICD-10-CM | POA: Diagnosis not present

## 2017-09-22 DIAGNOSIS — Z0000 Encounter for general adult medical examination without abnormal findings: Secondary | ICD-10-CM | POA: Diagnosis not present

## 2017-09-22 DIAGNOSIS — Z125 Encounter for screening for malignant neoplasm of prostate: Secondary | ICD-10-CM | POA: Diagnosis not present

## 2017-09-22 DIAGNOSIS — I129 Hypertensive chronic kidney disease with stage 1 through stage 4 chronic kidney disease, or unspecified chronic kidney disease: Secondary | ICD-10-CM | POA: Diagnosis not present

## 2017-09-22 DIAGNOSIS — M1990 Unspecified osteoarthritis, unspecified site: Secondary | ICD-10-CM | POA: Diagnosis not present

## 2017-09-22 DIAGNOSIS — N183 Chronic kidney disease, stage 3 (moderate): Secondary | ICD-10-CM | POA: Diagnosis not present

## 2017-09-22 DIAGNOSIS — E782 Mixed hyperlipidemia: Secondary | ICD-10-CM | POA: Diagnosis not present

## 2017-09-22 DIAGNOSIS — J449 Chronic obstructive pulmonary disease, unspecified: Secondary | ICD-10-CM | POA: Diagnosis not present

## 2017-10-19 DIAGNOSIS — S30861A Insect bite (nonvenomous) of abdominal wall, initial encounter: Secondary | ICD-10-CM | POA: Diagnosis not present

## 2017-10-19 DIAGNOSIS — W57XXXA Bitten or stung by nonvenomous insect and other nonvenomous arthropods, initial encounter: Secondary | ICD-10-CM | POA: Diagnosis not present

## 2017-10-19 DIAGNOSIS — R21 Rash and other nonspecific skin eruption: Secondary | ICD-10-CM | POA: Diagnosis not present

## 2017-11-02 DIAGNOSIS — M25551 Pain in right hip: Secondary | ICD-10-CM | POA: Diagnosis not present

## 2017-11-02 DIAGNOSIS — W57XXXD Bitten or stung by nonvenomous insect and other nonvenomous arthropods, subsequent encounter: Secondary | ICD-10-CM | POA: Diagnosis not present

## 2017-11-02 DIAGNOSIS — S30861D Insect bite (nonvenomous) of abdominal wall, subsequent encounter: Secondary | ICD-10-CM | POA: Diagnosis not present

## 2018-03-28 DIAGNOSIS — E1122 Type 2 diabetes mellitus with diabetic chronic kidney disease: Secondary | ICD-10-CM | POA: Diagnosis not present

## 2018-03-28 DIAGNOSIS — I129 Hypertensive chronic kidney disease with stage 1 through stage 4 chronic kidney disease, or unspecified chronic kidney disease: Secondary | ICD-10-CM | POA: Diagnosis not present

## 2018-03-28 DIAGNOSIS — R350 Frequency of micturition: Secondary | ICD-10-CM | POA: Diagnosis not present

## 2018-03-28 DIAGNOSIS — J449 Chronic obstructive pulmonary disease, unspecified: Secondary | ICD-10-CM | POA: Diagnosis not present

## 2018-03-28 DIAGNOSIS — E781 Pure hyperglyceridemia: Secondary | ICD-10-CM | POA: Diagnosis not present

## 2018-03-28 DIAGNOSIS — E782 Mixed hyperlipidemia: Secondary | ICD-10-CM | POA: Diagnosis not present

## 2018-09-27 DIAGNOSIS — N183 Chronic kidney disease, stage 3 (moderate): Secondary | ICD-10-CM | POA: Diagnosis not present

## 2018-09-27 DIAGNOSIS — I129 Hypertensive chronic kidney disease with stage 1 through stage 4 chronic kidney disease, or unspecified chronic kidney disease: Secondary | ICD-10-CM | POA: Diagnosis not present

## 2018-09-27 DIAGNOSIS — J449 Chronic obstructive pulmonary disease, unspecified: Secondary | ICD-10-CM | POA: Diagnosis not present

## 2018-09-27 DIAGNOSIS — E781 Pure hyperglyceridemia: Secondary | ICD-10-CM | POA: Diagnosis not present

## 2018-09-27 DIAGNOSIS — Z0000 Encounter for general adult medical examination without abnormal findings: Secondary | ICD-10-CM | POA: Diagnosis not present

## 2018-09-27 DIAGNOSIS — E782 Mixed hyperlipidemia: Secondary | ICD-10-CM | POA: Diagnosis not present

## 2018-09-27 DIAGNOSIS — R3915 Urgency of urination: Secondary | ICD-10-CM | POA: Diagnosis not present

## 2018-09-27 DIAGNOSIS — E1122 Type 2 diabetes mellitus with diabetic chronic kidney disease: Secondary | ICD-10-CM | POA: Diagnosis not present

## 2018-09-27 DIAGNOSIS — M1990 Unspecified osteoarthritis, unspecified site: Secondary | ICD-10-CM | POA: Diagnosis not present

## 2019-03-29 DIAGNOSIS — M1990 Unspecified osteoarthritis, unspecified site: Secondary | ICD-10-CM | POA: Diagnosis not present

## 2019-03-29 DIAGNOSIS — E1122 Type 2 diabetes mellitus with diabetic chronic kidney disease: Secondary | ICD-10-CM | POA: Diagnosis not present

## 2019-03-29 DIAGNOSIS — I129 Hypertensive chronic kidney disease with stage 1 through stage 4 chronic kidney disease, or unspecified chronic kidney disease: Secondary | ICD-10-CM | POA: Diagnosis not present

## 2019-03-29 DIAGNOSIS — Z794 Long term (current) use of insulin: Secondary | ICD-10-CM | POA: Diagnosis not present

## 2019-03-29 DIAGNOSIS — J449 Chronic obstructive pulmonary disease, unspecified: Secondary | ICD-10-CM | POA: Diagnosis not present

## 2019-03-29 DIAGNOSIS — E782 Mixed hyperlipidemia: Secondary | ICD-10-CM | POA: Diagnosis not present

## 2019-05-05 DIAGNOSIS — R809 Proteinuria, unspecified: Secondary | ICD-10-CM | POA: Diagnosis not present

## 2020-11-15 DIAGNOSIS — Z7984 Long term (current) use of oral hypoglycemic drugs: Secondary | ICD-10-CM | POA: Diagnosis not present

## 2020-11-15 DIAGNOSIS — K279 Peptic ulcer, site unspecified, unspecified as acute or chronic, without hemorrhage or perforation: Secondary | ICD-10-CM | POA: Diagnosis not present

## 2020-11-15 DIAGNOSIS — Z125 Encounter for screening for malignant neoplasm of prostate: Secondary | ICD-10-CM | POA: Diagnosis not present

## 2020-11-15 DIAGNOSIS — N183 Chronic kidney disease, stage 3 unspecified: Secondary | ICD-10-CM | POA: Diagnosis not present

## 2020-11-15 DIAGNOSIS — E782 Mixed hyperlipidemia: Secondary | ICD-10-CM | POA: Diagnosis not present

## 2020-11-15 DIAGNOSIS — M1990 Unspecified osteoarthritis, unspecified site: Secondary | ICD-10-CM | POA: Diagnosis not present

## 2020-11-15 DIAGNOSIS — I129 Hypertensive chronic kidney disease with stage 1 through stage 4 chronic kidney disease, or unspecified chronic kidney disease: Secondary | ICD-10-CM | POA: Diagnosis not present

## 2020-11-15 DIAGNOSIS — J449 Chronic obstructive pulmonary disease, unspecified: Secondary | ICD-10-CM | POA: Diagnosis not present

## 2020-11-15 DIAGNOSIS — E1122 Type 2 diabetes mellitus with diabetic chronic kidney disease: Secondary | ICD-10-CM | POA: Diagnosis not present

## 2020-11-15 DIAGNOSIS — Z0000 Encounter for general adult medical examination without abnormal findings: Secondary | ICD-10-CM | POA: Diagnosis not present

## 2021-05-09 ENCOUNTER — Encounter: Payer: Self-pay | Admitting: Endocrinology

## 2021-05-19 DIAGNOSIS — Z7984 Long term (current) use of oral hypoglycemic drugs: Secondary | ICD-10-CM | POA: Diagnosis not present

## 2021-05-19 DIAGNOSIS — E1122 Type 2 diabetes mellitus with diabetic chronic kidney disease: Secondary | ICD-10-CM | POA: Diagnosis not present

## 2021-05-19 DIAGNOSIS — E782 Mixed hyperlipidemia: Secondary | ICD-10-CM | POA: Diagnosis not present

## 2021-05-19 DIAGNOSIS — N1832 Chronic kidney disease, stage 3b: Secondary | ICD-10-CM | POA: Diagnosis not present

## 2021-05-19 DIAGNOSIS — Z9181 History of falling: Secondary | ICD-10-CM | POA: Diagnosis not present

## 2021-05-19 DIAGNOSIS — I129 Hypertensive chronic kidney disease with stage 1 through stage 4 chronic kidney disease, or unspecified chronic kidney disease: Secondary | ICD-10-CM | POA: Diagnosis not present

## 2021-06-16 DIAGNOSIS — E1122 Type 2 diabetes mellitus with diabetic chronic kidney disease: Secondary | ICD-10-CM | POA: Diagnosis not present

## 2021-06-16 DIAGNOSIS — J449 Chronic obstructive pulmonary disease, unspecified: Secondary | ICD-10-CM | POA: Diagnosis not present

## 2021-06-16 DIAGNOSIS — Z794 Long term (current) use of insulin: Secondary | ICD-10-CM | POA: Diagnosis not present

## 2021-06-16 DIAGNOSIS — I129 Hypertensive chronic kidney disease with stage 1 through stage 4 chronic kidney disease, or unspecified chronic kidney disease: Secondary | ICD-10-CM | POA: Diagnosis not present

## 2022-01-05 ENCOUNTER — Emergency Department (HOSPITAL_BASED_OUTPATIENT_CLINIC_OR_DEPARTMENT_OTHER)
Admission: EM | Admit: 2022-01-05 | Discharge: 2022-01-05 | Discharge status: Against Medical Advice | Payer: Medicare HMO | Attending: Emergency Medicine | Admitting: Emergency Medicine

## 2022-01-05 ENCOUNTER — Encounter (HOSPITAL_BASED_OUTPATIENT_CLINIC_OR_DEPARTMENT_OTHER): Payer: Self-pay | Admitting: *Deleted

## 2022-01-05 ENCOUNTER — Other Ambulatory Visit: Payer: Self-pay

## 2022-01-05 DIAGNOSIS — R739 Hyperglycemia, unspecified: Secondary | ICD-10-CM | POA: Insufficient documentation

## 2022-01-05 DIAGNOSIS — Z5321 Procedure and treatment not carried out due to patient leaving prior to being seen by health care provider: Secondary | ICD-10-CM | POA: Insufficient documentation

## 2022-01-05 HISTORY — DX: Type 2 diabetes mellitus without complications: E11.9

## 2022-01-05 LAB — CBG MONITORING, ED: Glucose-Capillary: 205 mg/dL — ABNORMAL HIGH (ref 70–99)

## 2022-01-05 NOTE — ED Triage Notes (Signed)
Pt was brought in by family because his neighbor advised them that "last week she checked his sugar and it was 400". Pt does not regularly check his CBG but he does take his medications as prescribed.  Pt appears in no distress at this time. He states that he feels well.  CBG 205 in triage.  When I asked family if they had any concerns since he does look well to me they said "we are just pacifying the neighbor"  Pt sates that he feels well.

## 2022-01-05 NOTE — ED Notes (Signed)
No answer when called back for room assignment

## 2022-02-05 DIAGNOSIS — Z7982 Long term (current) use of aspirin: Secondary | ICD-10-CM | POA: Diagnosis not present

## 2022-02-05 DIAGNOSIS — R69 Illness, unspecified: Secondary | ICD-10-CM | POA: Diagnosis not present

## 2022-02-05 DIAGNOSIS — I63542 Cerebral infarction due to unspecified occlusion or stenosis of left cerebellar artery: Secondary | ICD-10-CM | POA: Diagnosis not present

## 2022-02-05 DIAGNOSIS — R0682 Tachypnea, not elsewhere classified: Secondary | ICD-10-CM | POA: Diagnosis not present

## 2022-02-05 DIAGNOSIS — Z87891 Personal history of nicotine dependence: Secondary | ICD-10-CM | POA: Diagnosis not present

## 2022-02-05 DIAGNOSIS — I959 Hypotension, unspecified: Secondary | ICD-10-CM | POA: Diagnosis not present

## 2022-02-05 DIAGNOSIS — D72829 Elevated white blood cell count, unspecified: Secondary | ICD-10-CM | POA: Diagnosis not present

## 2022-02-05 DIAGNOSIS — E1122 Type 2 diabetes mellitus with diabetic chronic kidney disease: Secondary | ICD-10-CM | POA: Diagnosis not present

## 2022-02-05 DIAGNOSIS — E1129 Type 2 diabetes mellitus with other diabetic kidney complication: Secondary | ICD-10-CM | POA: Diagnosis not present

## 2022-02-05 DIAGNOSIS — N183 Chronic kidney disease, stage 3 unspecified: Secondary | ICD-10-CM | POA: Diagnosis not present

## 2022-02-05 DIAGNOSIS — I519 Heart disease, unspecified: Secondary | ICD-10-CM | POA: Diagnosis not present

## 2022-02-05 DIAGNOSIS — I639 Cerebral infarction, unspecified: Secondary | ICD-10-CM | POA: Diagnosis not present

## 2022-02-05 DIAGNOSIS — R410 Disorientation, unspecified: Secondary | ICD-10-CM | POA: Diagnosis not present

## 2022-02-05 DIAGNOSIS — Z8673 Personal history of transient ischemic attack (TIA), and cerebral infarction without residual deficits: Secondary | ICD-10-CM | POA: Diagnosis not present

## 2022-02-05 DIAGNOSIS — R0902 Hypoxemia: Secondary | ICD-10-CM | POA: Diagnosis not present

## 2022-02-05 DIAGNOSIS — J111 Influenza due to unidentified influenza virus with other respiratory manifestations: Secondary | ICD-10-CM | POA: Diagnosis not present

## 2022-02-05 DIAGNOSIS — J440 Chronic obstructive pulmonary disease with acute lower respiratory infection: Secondary | ICD-10-CM | POA: Diagnosis not present

## 2022-02-05 DIAGNOSIS — Z7984 Long term (current) use of oral hypoglycemic drugs: Secondary | ICD-10-CM | POA: Diagnosis not present

## 2022-02-05 DIAGNOSIS — R509 Fever, unspecified: Secondary | ICD-10-CM | POA: Diagnosis not present

## 2022-02-05 DIAGNOSIS — Z794 Long term (current) use of insulin: Secondary | ICD-10-CM | POA: Diagnosis not present

## 2022-02-05 DIAGNOSIS — Z792 Long term (current) use of antibiotics: Secondary | ICD-10-CM | POA: Diagnosis not present

## 2022-02-05 DIAGNOSIS — G9389 Other specified disorders of brain: Secondary | ICD-10-CM | POA: Diagnosis not present

## 2022-02-05 DIAGNOSIS — N179 Acute kidney failure, unspecified: Secondary | ICD-10-CM | POA: Diagnosis not present

## 2022-02-05 DIAGNOSIS — Z79899 Other long term (current) drug therapy: Secondary | ICD-10-CM | POA: Diagnosis not present

## 2022-02-05 DIAGNOSIS — R0689 Other abnormalities of breathing: Secondary | ICD-10-CM | POA: Diagnosis not present

## 2022-02-05 DIAGNOSIS — I129 Hypertensive chronic kidney disease with stage 1 through stage 4 chronic kidney disease, or unspecified chronic kidney disease: Secondary | ICD-10-CM | POA: Diagnosis not present

## 2022-02-05 DIAGNOSIS — R531 Weakness: Secondary | ICD-10-CM | POA: Diagnosis not present

## 2022-02-05 DIAGNOSIS — N390 Urinary tract infection, site not specified: Secondary | ICD-10-CM | POA: Diagnosis not present

## 2022-02-05 DIAGNOSIS — R4182 Altered mental status, unspecified: Secondary | ICD-10-CM | POA: Diagnosis not present

## 2022-02-05 DIAGNOSIS — E875 Hyperkalemia: Secondary | ICD-10-CM | POA: Diagnosis not present

## 2022-02-05 DIAGNOSIS — Z20822 Contact with and (suspected) exposure to covid-19: Secondary | ICD-10-CM | POA: Diagnosis not present

## 2022-02-05 DIAGNOSIS — I517 Cardiomegaly: Secondary | ICD-10-CM | POA: Diagnosis not present

## 2022-02-05 DIAGNOSIS — R739 Hyperglycemia, unspecified: Secondary | ICD-10-CM | POA: Diagnosis not present

## 2022-02-09 DIAGNOSIS — I639 Cerebral infarction, unspecified: Secondary | ICD-10-CM | POA: Diagnosis not present

## 2022-02-09 DIAGNOSIS — N179 Acute kidney failure, unspecified: Secondary | ICD-10-CM | POA: Diagnosis not present

## 2022-02-09 DIAGNOSIS — E1122 Type 2 diabetes mellitus with diabetic chronic kidney disease: Secondary | ICD-10-CM | POA: Diagnosis not present

## 2022-02-09 DIAGNOSIS — Z794 Long term (current) use of insulin: Secondary | ICD-10-CM | POA: Diagnosis not present

## 2022-02-09 DIAGNOSIS — N183 Chronic kidney disease, stage 3 unspecified: Secondary | ICD-10-CM | POA: Diagnosis not present

## 2022-02-09 DIAGNOSIS — R531 Weakness: Secondary | ICD-10-CM | POA: Diagnosis not present

## 2022-02-09 DIAGNOSIS — E875 Hyperkalemia: Secondary | ICD-10-CM | POA: Diagnosis not present

## 2022-02-09 DIAGNOSIS — I129 Hypertensive chronic kidney disease with stage 1 through stage 4 chronic kidney disease, or unspecified chronic kidney disease: Secondary | ICD-10-CM | POA: Diagnosis not present

## 2022-02-09 DIAGNOSIS — Z792 Long term (current) use of antibiotics: Secondary | ICD-10-CM | POA: Diagnosis not present

## 2022-02-09 DIAGNOSIS — E1129 Type 2 diabetes mellitus with other diabetic kidney complication: Secondary | ICD-10-CM | POA: Diagnosis not present

## 2022-02-09 DIAGNOSIS — D72829 Elevated white blood cell count, unspecified: Secondary | ICD-10-CM | POA: Diagnosis not present

## 2022-02-09 DIAGNOSIS — N390 Urinary tract infection, site not specified: Secondary | ICD-10-CM | POA: Diagnosis not present

## 2022-02-16 DIAGNOSIS — N1832 Chronic kidney disease, stage 3b: Secondary | ICD-10-CM | POA: Diagnosis not present

## 2022-02-16 DIAGNOSIS — I129 Hypertensive chronic kidney disease with stage 1 through stage 4 chronic kidney disease, or unspecified chronic kidney disease: Secondary | ICD-10-CM | POA: Diagnosis not present

## 2022-02-16 DIAGNOSIS — Z9181 History of falling: Secondary | ICD-10-CM | POA: Diagnosis not present

## 2022-02-16 DIAGNOSIS — J449 Chronic obstructive pulmonary disease, unspecified: Secondary | ICD-10-CM | POA: Diagnosis not present

## 2022-02-16 DIAGNOSIS — Z8673 Personal history of transient ischemic attack (TIA), and cerebral infarction without residual deficits: Secondary | ICD-10-CM | POA: Diagnosis not present

## 2022-02-16 DIAGNOSIS — E782 Mixed hyperlipidemia: Secondary | ICD-10-CM | POA: Diagnosis not present

## 2022-02-16 DIAGNOSIS — A498 Other bacterial infections of unspecified site: Secondary | ICD-10-CM | POA: Diagnosis not present

## 2022-02-16 DIAGNOSIS — K279 Peptic ulcer, site unspecified, unspecified as acute or chronic, without hemorrhage or perforation: Secondary | ICD-10-CM | POA: Diagnosis not present

## 2022-02-16 DIAGNOSIS — E1122 Type 2 diabetes mellitus with diabetic chronic kidney disease: Secondary | ICD-10-CM | POA: Diagnosis not present

## 2022-02-17 DIAGNOSIS — I129 Hypertensive chronic kidney disease with stage 1 through stage 4 chronic kidney disease, or unspecified chronic kidney disease: Secondary | ICD-10-CM | POA: Diagnosis not present

## 2022-02-17 DIAGNOSIS — N189 Chronic kidney disease, unspecified: Secondary | ICD-10-CM | POA: Diagnosis not present

## 2022-02-17 DIAGNOSIS — Z7984 Long term (current) use of oral hypoglycemic drugs: Secondary | ICD-10-CM | POA: Diagnosis not present

## 2022-02-17 DIAGNOSIS — G9341 Metabolic encephalopathy: Secondary | ICD-10-CM | POA: Diagnosis not present

## 2022-02-17 DIAGNOSIS — Z794 Long term (current) use of insulin: Secondary | ICD-10-CM | POA: Diagnosis not present

## 2022-02-17 DIAGNOSIS — E875 Hyperkalemia: Secondary | ICD-10-CM | POA: Diagnosis not present

## 2022-02-17 DIAGNOSIS — E785 Hyperlipidemia, unspecified: Secondary | ICD-10-CM | POA: Diagnosis not present

## 2022-02-17 DIAGNOSIS — Z7902 Long term (current) use of antithrombotics/antiplatelets: Secondary | ICD-10-CM | POA: Diagnosis not present

## 2022-02-17 DIAGNOSIS — E860 Dehydration: Secondary | ICD-10-CM | POA: Diagnosis not present

## 2022-02-17 DIAGNOSIS — Z8673 Personal history of transient ischemic attack (TIA), and cerebral infarction without residual deficits: Secondary | ICD-10-CM | POA: Diagnosis not present

## 2022-02-17 DIAGNOSIS — N183 Chronic kidney disease, stage 3 unspecified: Secondary | ICD-10-CM | POA: Diagnosis not present

## 2022-02-17 DIAGNOSIS — N179 Acute kidney failure, unspecified: Secondary | ICD-10-CM | POA: Diagnosis not present

## 2022-02-17 DIAGNOSIS — E1122 Type 2 diabetes mellitus with diabetic chronic kidney disease: Secondary | ICD-10-CM | POA: Diagnosis not present

## 2022-02-17 DIAGNOSIS — Z79899 Other long term (current) drug therapy: Secondary | ICD-10-CM | POA: Diagnosis not present

## 2022-02-18 DIAGNOSIS — I129 Hypertensive chronic kidney disease with stage 1 through stage 4 chronic kidney disease, or unspecified chronic kidney disease: Secondary | ICD-10-CM | POA: Diagnosis not present

## 2022-02-18 DIAGNOSIS — N179 Acute kidney failure, unspecified: Secondary | ICD-10-CM | POA: Diagnosis not present

## 2022-02-18 DIAGNOSIS — Z794 Long term (current) use of insulin: Secondary | ICD-10-CM | POA: Diagnosis not present

## 2022-02-18 DIAGNOSIS — N183 Chronic kidney disease, stage 3 unspecified: Secondary | ICD-10-CM | POA: Diagnosis not present

## 2022-02-18 DIAGNOSIS — Z8673 Personal history of transient ischemic attack (TIA), and cerebral infarction without residual deficits: Secondary | ICD-10-CM | POA: Diagnosis not present

## 2022-02-18 DIAGNOSIS — E1122 Type 2 diabetes mellitus with diabetic chronic kidney disease: Secondary | ICD-10-CM | POA: Diagnosis not present

## 2022-02-18 DIAGNOSIS — E785 Hyperlipidemia, unspecified: Secondary | ICD-10-CM | POA: Diagnosis not present

## 2022-03-10 DIAGNOSIS — N1832 Chronic kidney disease, stage 3b: Secondary | ICD-10-CM | POA: Diagnosis not present

## 2022-03-10 DIAGNOSIS — E1122 Type 2 diabetes mellitus with diabetic chronic kidney disease: Secondary | ICD-10-CM | POA: Diagnosis not present
